# Patient Record
Sex: Female | Born: 1975 | Race: Black or African American | Hispanic: No | Marital: Married | Smoking: Never smoker
Health system: Southern US, Community
[De-identification: ages and names within clinical notes are randomized; demographics above are authoritative.]

---

## 2017-12-08 ENCOUNTER — Encounter (HOSPITAL_COMMUNITY): Payer: Self-pay | Admitting: Emergency Medicine

## 2017-12-08 ENCOUNTER — Ambulatory Visit (HOSPITAL_COMMUNITY)
Admission: EM | Admit: 2017-12-08 | Discharge: 2017-12-08 | Disposition: A | Discharge status: Home / Self Care | Payer: BLUE CROSS/BLUE SHIELD | Attending: Family Medicine | Admitting: Family Medicine

## 2017-12-08 DIAGNOSIS — W540XXA Bitten by dog, initial encounter: Secondary | ICD-10-CM | POA: Diagnosis not present

## 2017-12-08 DIAGNOSIS — S51851A Open bite of right forearm, initial encounter: Secondary | ICD-10-CM

## 2017-12-08 MED ORDER — DOXYCYCLINE HYCLATE 100 MG PO CAPS: 100.0000 mg | ORAL_CAPSULE | Freq: Two times a day (BID) | ORAL | 0 refills | Status: AC

## 2017-12-08 NOTE — Discharge Instructions (Addendum)
Wash wounds with soap and water daily. Keep covered to keep clean Ice, ibuprofen for pain control. Course of antibiotics for infection prophylaxis.  Return to be seen if you develop increased pain, redness, pus/drainage or further concerns of infection.

## 2017-12-08 NOTE — ED Provider Notes (Signed)
[facility]    CSN: [id number] Arrival date & time: 12/08/17  1043     History   Chief Complaint Chief Complaint  Patient presents with  . Animal Bite    HPI Julia Ellis is a 42 y.o. female.   Patient presents with complaints of dog bite wounds to her right forearm. She went to grab her pitbull/mastiff dog's collar as he was in trouble for urinating on the floor, as she reached he turned and bit her forearm, quickly letting go. She had bleeding from the site, she cleansed one area. The dog if fully vaccinated. She states she has since taken the dog to the shelter as she had kids and she feels fearful of this response. Her last tetanus was in 2017.    ROS per HPI.       History reviewed. No pertinent past medical history.  There are no active problems to display for this patient.   History reviewed. No pertinent surgical history.  OB History    No data available       Home Medications    Prior to Admission medications   Medication Sig Start Date End Date Taking? Authorizing Provider  doxycycline (VIBRAMYCIN) 100 MG capsule Take 1 capsule (100 mg total) by mouth 2 (two) times daily for 7 days. 12/08/17 12/15/17  Georgetta HaberBurky, Justa Hatchell B, NP    Family History History reviewed. No pertinent family history.  Social History Social History   Tobacco Use  . Smoking status: Never Smoker  . Smokeless tobacco: Never Used  Substance Use Topics  . Alcohol use: Not on file  . Drug use: Not on file     Allergies   Prochlorperazine; Zofran [ondansetron hcl]; and Penicillins   Review of Systems Review of Systems   Physical Exam Triage Vital Signs ED Triage Vitals [12/08/17 1200]  Enc Vitals Group     BP 115/71     Pulse Rate 67     Resp 16     Temp 98.5 F (36.9 C)     Temp Source Oral     SpO2 100 %     Weight      Height      Head Circumference      Peak Flow      Pain Score 8     Pain Loc      Pain Edu?      Excl. in GC?    No data  found.  Updated Vital Signs BP 115/71 (BP Location: Left Arm)   Pulse 67   Temp 98.5 F (36.9 C) (Oral)   Resp 16   LMP 11/27/2017   SpO2 100%   Visual Acuity Right Eye Distance:   Left Eye Distance:   Bilateral Distance:    Right Eye Near:   Left Eye Near:    Bilateral Near:     Physical Exam  Constitutional: She is oriented to person, place, and time. She appears well-developed and well-nourished. No distress.  Cardiovascular: Normal rate, regular rhythm and normal heart sounds.  Pulmonary/Chest: Effort normal and breath sounds normal.  Musculoskeletal:       Right forearm: She exhibits laceration. She exhibits no bony tenderness.       Arms: Two puncture wounds to right forearm, anterior and posterior; small amount of surrounding tenderness; approximately 2mm deep, 3 mm in length; irrigated and washed with soap and water; full ROm to arm, wrist and hand without numbness or tingling to hand. See photos  Neurological: She is alert and oriented to person, place, and time.  Skin: Skin is warm and dry.         UC Treatments / Results  Labs (all labs ordered are listed, but only abnormal results are displayed) Labs Reviewed - No data to display  EKG  EKG Interpretation None       Radiology No results found.  Procedures Procedures (including critical care time)  Medications Ordered in UC Medications - No data to display   Initial Impression / Assessment and Plan / UC Course  I have reviewed the triage vital signs and the nursing notes.  Pertinent labs & imaging results that were available during my care of the patient were reviewed by me and considered in my medical decision making (see chart for details).     Dog is personal dog and is at shelter, known to be vaccinated. No rabies risk at this time. Wounds irrigated and cleansed in clinic. Wound care discussed. Prophylactic antibiotics initiated at this time with wound infection precautions provided.  Patient verbalized understanding and agreeable to plan.    Final Clinical Impressions(s) / UC Diagnoses   Final diagnoses:  Dog bite, initial encounter    ED Discharge Orders        Ordered    doxycycline (VIBRAMYCIN) 100 MG capsule  2 times daily     12/08/17 1229       Controlled Substance Prescriptions [place] Controlled Substance Registry consulted? Not Applicable   Georgetta Haber, NP 12/08/17 1240

## 2017-12-08 NOTE — ED Triage Notes (Signed)
PT C/O: reports her dog Administrator, sports(Pitbull mastiff) bit her right forearm  Sts her dog is UTD w/vaccinations   Has a compression dressing on site  A&O x4... NAD... Ambulatory

## 2020-05-01 ENCOUNTER — Other Ambulatory Visit: Payer: Self-pay

## 2020-05-01 ENCOUNTER — Emergency Department (HOSPITAL_COMMUNITY)
Admission: EM | Admit: 2020-05-01 | Discharge: 2020-05-01 | Disposition: A | Discharge status: Home / Self Care | Payer: BLUE CROSS/BLUE SHIELD | Attending: Emergency Medicine | Admitting: Emergency Medicine

## 2020-05-01 ENCOUNTER — Emergency Department (HOSPITAL_COMMUNITY): Payer: BLUE CROSS/BLUE SHIELD

## 2020-05-01 DIAGNOSIS — R079 Chest pain, unspecified: Secondary | ICD-10-CM

## 2020-05-01 DIAGNOSIS — M79602 Pain in left arm: Secondary | ICD-10-CM | POA: Insufficient documentation

## 2020-05-01 DIAGNOSIS — R0789 Other chest pain: Secondary | ICD-10-CM | POA: Insufficient documentation

## 2020-05-01 LAB — I-STAT BETA HCG BLOOD, ED (MC, WL, AP ONLY): I-stat hCG, quantitative: <5 m[IU]/mL (ref <5)

## 2020-05-01 LAB — CBC
HCT: 37.3 % (ref 36.0–46.0)
Hemoglobin: 12.5 g/dL (ref 12.0–15.0)
MCH: 31.6 pg (ref 26.0–34.0)
MCHC: 33.5 g/dL (ref 30.0–36.0)
MCV: 94.2 fL (ref 80.0–100.0)
Platelets: 174 10*3/uL (ref 150–400)
RBC: 3.96 MIL/uL (ref 3.87–5.11)
RDW: 12.1 % (ref 11.5–15.5)
WBC: 6.1 10*3/uL (ref 4.0–10.5)
nRBC: 0 % (ref 0.0–0.2)

## 2020-05-01 LAB — BASIC METABOLIC PANEL
Anion gap: 9 (ref 5–15)
BUN: 11 mg/dL (ref 6–20)
CO2: 22 mmol/L (ref 22–32)
Calcium: 9.3 mg/dL (ref 8.9–10.3)
Chloride: 105 mmol/L (ref 98–111)
Creatinine, Ser: 0.87 mg/dL (ref 0.44–1.00)
GFR calc Af Amer: >60 mL/min (ref >60)
GFR calc non Af Amer: >60 mL/min (ref >60)
Glucose, Bld: 95 mg/dL (ref 70–99)
Potassium: 4.1 mmol/L (ref 3.5–5.1)
Sodium: 136 mmol/L (ref 135–145)

## 2020-05-01 LAB — TROPONIN I (HIGH SENSITIVITY)
Troponin I (High Sensitivity): 4 ng/L (ref <18)
Troponin I (High Sensitivity): 4 ng/L (ref <18)

## 2020-05-01 MED ORDER — ACETAMINOPHEN 325 MG PO TABS: 650.0000 mg | ORAL_TABLET | Freq: Once | ORAL | Status: DC

## 2020-05-01 NOTE — ED Triage Notes (Signed)
Pt endorses L sided chest pain with radiation to L arm that started 1.5 hours ago with nausea and lightheadedness. Pain worse with movement.

## 2020-05-01 NOTE — ED Provider Notes (Signed)
[facility] EMERGENCY DEPARTMENT Provider Note   CSN: [id number] Arrival date & time: 05/01/20  1725     History Chief Complaint  Patient presents with  . Chest Pain    Julia Ellis is a 44 y.o. female.  44 year old female who presents with chest pain which lasted for approximately 1/2 hours. Pain radiates to her left arm and was somewhat positional. Patient notes he is been stressed recently. Denies any exertional component to it. No fever cough or congestion. Took Tylenol without relief. No prior history of same.        No past medical history on file.  There are no problems to display for this patient.   No past surgical history on file.   OB History   No obstetric history on file.     No family history on file.  Social History   Tobacco Use  . Smoking status: Never Smoker  . Smokeless tobacco: Never Used  Substance Use Topics  . Alcohol use: Not on file  . Drug use: Not on file    Home Medications Prior to Admission medications   Not on File    Allergies    Prochlorperazine, Zofran [ondansetron hcl], and Penicillins  Review of Systems   Review of Systems  All other systems reviewed and are negative.   Physical Exam Updated Vital Signs BP 114/65   Pulse 71   Temp 98.6 F (37 C)   Resp 17   SpO2 100%   Physical Exam Vitals and nursing note reviewed.  Constitutional:      General: She is not in acute distress.    Appearance: Normal appearance. She is well-developed. She is not toxic-appearing.  HENT:     Head: Normocephalic and atraumatic.  Eyes:     General: Lids are normal.     Conjunctiva/sclera: Conjunctivae normal.     Pupils: Pupils are equal, round, and reactive to light.  Neck:     Thyroid: No thyroid mass.     Trachea: No tracheal deviation.  Cardiovascular:     Rate and Rhythm: Normal rate and regular rhythm.     Heart sounds: Normal heart sounds. No murmur heard.  No gallop.   Pulmonary:     Effort:  Pulmonary effort is normal. No respiratory distress.     Breath sounds: Normal breath sounds. No stridor. No decreased breath sounds, wheezing, rhonchi or rales.  Abdominal:     General: Bowel sounds are normal. There is no distension.     Palpations: Abdomen is soft.     Tenderness: There is no abdominal tenderness. There is no rebound.  Musculoskeletal:        General: No tenderness. Normal range of motion.     Cervical back: Normal range of motion and neck supple.  Skin:    General: Skin is warm and dry.     Findings: No abrasion or rash.  Neurological:     Mental Status: She is alert and oriented to person, place, and time.     GCS: GCS eye subscore is 4. GCS verbal subscore is 5. GCS motor subscore is 6.     Cranial Nerves: No cranial nerve deficit.     Sensory: No sensory deficit.  Psychiatric:        Speech: Speech normal.        Behavior: Behavior normal.     ED Results / Procedures / Treatments   Labs (all labs ordered are listed, but only abnormal results are  displayed) Labs Reviewed  BASIC METABOLIC PANEL  CBC  I-STAT BETA HCG BLOOD, ED ([facility], [facility], [facility] ONLY)  TROPONIN I (HIGH SENSITIVITY)  TROPONIN I (HIGH SENSITIVITY)    EKG EKG Interpretation  Date/Time:  Friday May 01 2020 17:36:56 EDT Ventricular Rate:  71 PR Interval:  120 QRS Duration: 74 QT Interval:  396 QTC Calculation: 430 R Axis:   47 Text Interpretation: Normal sinus rhythm Septal infarct , age undetermined Abnormal ECG No old tracing to compare Confirmed by Lorre Nick ([id number]) on 05/01/2020 10:48:41 PM   Radiology DG Chest 2 View  Result Date: 05/01/2020 CLINICAL DATA:  Chest pain EXAM: CHEST - 2 VIEW COMPARISON:  None. FINDINGS: The heart size and mediastinal contours are within normal limits. Both lungs are clear. The visualized skeletal structures are unremarkable. IMPRESSION: No active cardiopulmonary disease. Electronically Signed   By: Gerome Sam III M.D   On: 05/01/2020 18:32     Procedures Procedures (including critical care time)  Medications Ordered in ED Medications - No data to display  ED Course  I have reviewed the triage vital signs and the nursing notes.  Pertinent labs & imaging results that were available during my care of the patient were reviewed by me and considered in my medical decision making (see chart for details).    MDM Rules/Calculators/A&P                          Patient is EKG without acute ischemic changes. Delta troponin negative here. Patient's heart score is less than 5. Low suspicion for ACS. Will discharge and have her follow-up with her doctor Final Clinical Impression(s) / ED Diagnoses Final diagnoses:  None    Rx / DC Orders ED Discharge Orders    None       Lorre Nick, MD 05/01/20 2305

## 2021-08-08 IMAGING — CR DG CHEST 2V
2 series · 2 of 2 positions shown · non-contrast
Comparison: None.

CLINICAL DATA: Chest pain

EXAM:
CHEST - 2 VIEW

[chest pa]
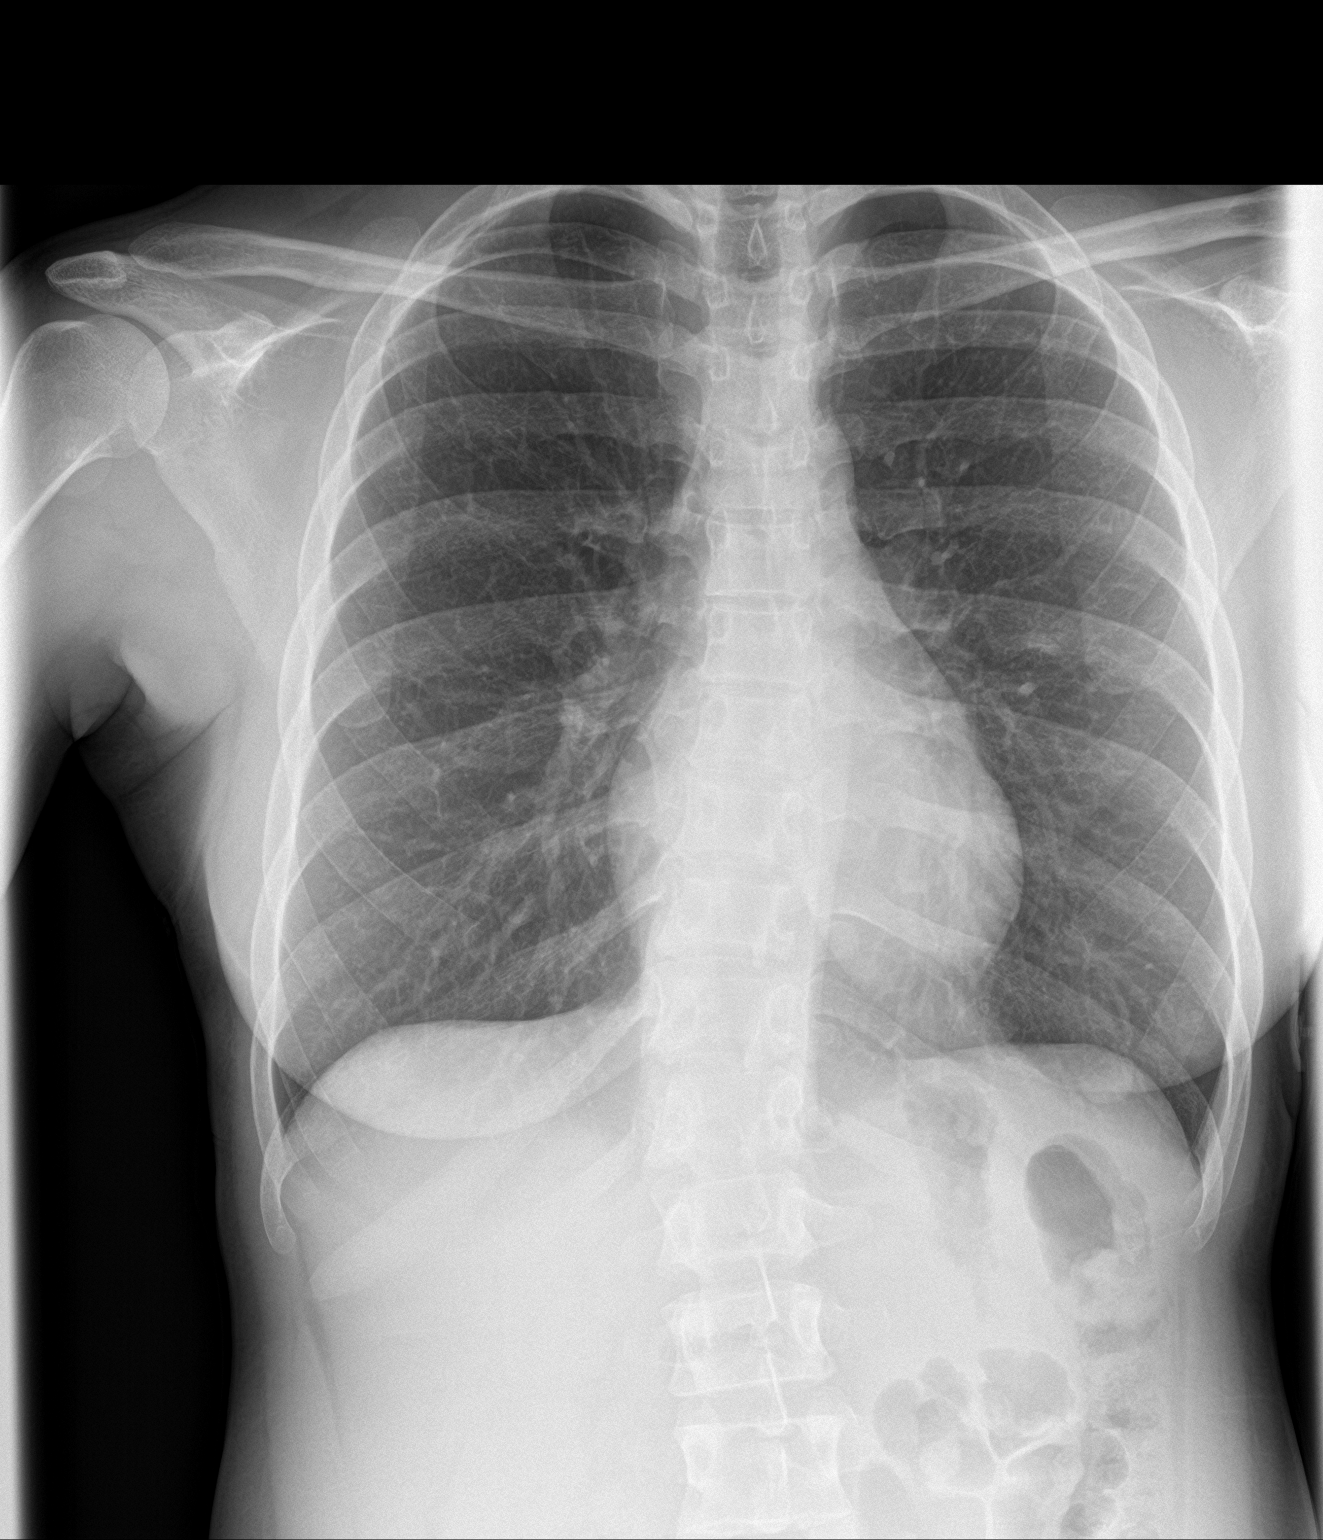

[chest lat]
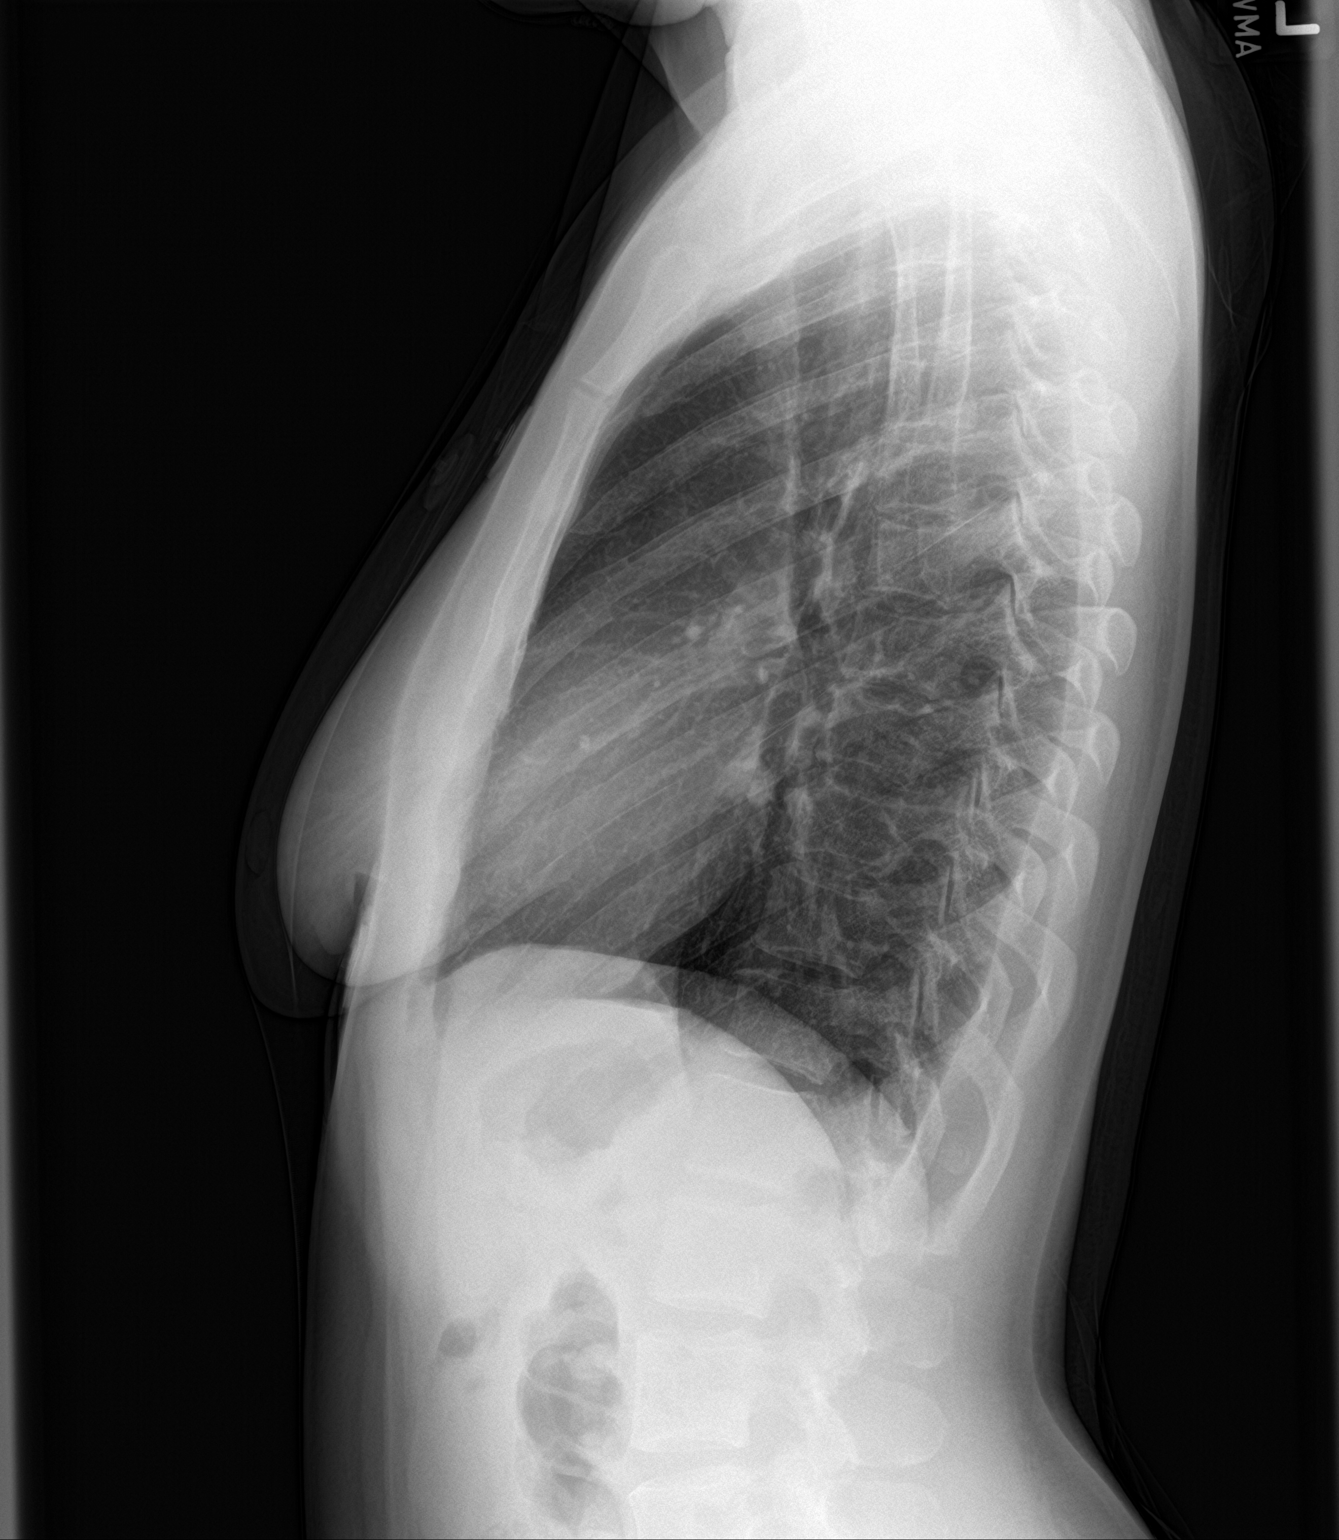

[2 of 2 positions shown; findings below may reference images not displayed]

FINDINGS: The heart size and mediastinal contours are within normal limits.
Both lungs are clear. The visualized skeletal structures are
unremarkable.
IMPRESSION: No active cardiopulmonary disease.
